# Patient Record
Sex: Male | Born: 1950 | Race: White | Hispanic: No | Marital: Married
Health system: Southern US, Community
[De-identification: ages and names within clinical notes are randomized; demographics above are authoritative.]

---

## 2010-09-05 ENCOUNTER — Emergency Department (HOSPITAL_COMMUNITY): Payer: Self-pay

## 2010-09-05 ENCOUNTER — Emergency Department (HOSPITAL_COMMUNITY)
Admission: EM | Admit: 2010-09-05 | Discharge: 2010-09-05 | Disposition: A | Discharge status: Home / Self Care | Payer: Self-pay | Attending: Emergency Medicine | Admitting: Emergency Medicine

## 2010-09-05 DIAGNOSIS — R339 Retention of urine, unspecified: Secondary | ICD-10-CM | POA: Insufficient documentation

## 2010-09-05 DIAGNOSIS — R319 Hematuria, unspecified: Secondary | ICD-10-CM | POA: Insufficient documentation

## 2010-09-05 DIAGNOSIS — N289 Disorder of kidney and ureter, unspecified: Secondary | ICD-10-CM | POA: Insufficient documentation

## 2010-09-05 DIAGNOSIS — R109 Unspecified abdominal pain: Secondary | ICD-10-CM | POA: Insufficient documentation

## 2010-09-05 LAB — DIFFERENTIAL
Lymphocytes Relative: 11 % — ABNORMAL LOW (ref 12–46)
Lymphs Abs: 1.1 10*3/uL (ref 0.7–4.0)
Monocytes Relative: 4 % (ref 3–12)
Neutro Abs: 8.1 10*3/uL — ABNORMAL HIGH (ref 1.7–7.7)
Neutrophils Relative %: 84 % — ABNORMAL HIGH (ref 43–77)

## 2010-09-05 LAB — BASIC METABOLIC PANEL
BUN: 18 mg/dL (ref 6–23)
CO2: 21 mEq/L (ref 19–32)
Chloride: 102 mEq/L (ref 96–112)
Creatinine, Ser: 1.35 mg/dL (ref 0.4–1.5)
Potassium: 3.6 mEq/L (ref 3.5–5.1)

## 2010-09-05 LAB — URINALYSIS, ROUTINE W REFLEX MICROSCOPIC
Bilirubin Urine: NEGATIVE
Glucose, UA: NEGATIVE mg/dL
Specific Gravity, Urine: 1.015 (ref 1.005–1.030)
Urobilinogen, UA: 0.2 mg/dL (ref 0.0–1.0)

## 2010-09-05 LAB — CBC
HCT: 47.8 % (ref 39.0–52.0)
Hemoglobin: 16.6 g/dL (ref 13.0–17.0)
MCH: 29.4 pg (ref 26.0–34.0)
MCV: 84.8 fL (ref 78.0–100.0)
Platelets: 218 10*3/uL (ref 150–400)
RBC: 5.64 MIL/uL (ref 4.22–5.81)
WBC: 9.7 10*3/uL (ref 4.0–10.5)

## 2010-09-05 LAB — URINE MICROSCOPIC-ADD ON

## 2010-09-11 ENCOUNTER — Ambulatory Visit: Payer: Self-pay | Admitting: Oncology

## 2010-10-09 ENCOUNTER — Ambulatory Visit: Payer: Self-pay | Admitting: Oncology

## 2010-11-17 ENCOUNTER — Emergency Department (HOSPITAL_COMMUNITY)
Admission: EM | Admit: 2010-11-17 | Discharge: 2010-11-17 | Disposition: A | Discharge status: Home / Self Care | Payer: Self-pay | Attending: Emergency Medicine | Admitting: Emergency Medicine

## 2010-11-17 DIAGNOSIS — Z85528 Personal history of other malignant neoplasm of kidney: Secondary | ICD-10-CM | POA: Insufficient documentation

## 2010-11-17 DIAGNOSIS — Z87442 Personal history of urinary calculi: Secondary | ICD-10-CM | POA: Insufficient documentation

## 2010-11-17 DIAGNOSIS — M109 Gout, unspecified: Secondary | ICD-10-CM | POA: Insufficient documentation

## 2010-11-17 DIAGNOSIS — L03319 Cellulitis of trunk, unspecified: Secondary | ICD-10-CM | POA: Insufficient documentation

## 2010-11-17 DIAGNOSIS — L02219 Cutaneous abscess of trunk, unspecified: Secondary | ICD-10-CM | POA: Insufficient documentation

## 2010-11-17 LAB — BASIC METABOLIC PANEL WITH GFR
BUN: 28 mg/dL — ABNORMAL HIGH (ref 6–23)
CO2: 30 meq/L (ref 19–32)
Calcium: 10.6 mg/dL — ABNORMAL HIGH (ref 8.4–10.5)
Chloride: 96 meq/L (ref 96–112)
Creatinine, Ser: 1.56 mg/dL — ABNORMAL HIGH (ref 0.4–1.5)
GFR calc Af Amer: 55 mL/min — ABNORMAL LOW
GFR calc non Af Amer: 46 mL/min — ABNORMAL LOW
Glucose, Bld: 104 mg/dL — ABNORMAL HIGH (ref 70–99)
Potassium: 4.7 meq/L (ref 3.5–5.1)
Sodium: 136 meq/L (ref 135–145)

## 2010-11-17 LAB — CBC
HCT: 39.9 % (ref 39.0–52.0)
Hemoglobin: 13.7 g/dL (ref 13.0–17.0)
MCH: 29.5 pg (ref 26.0–34.0)
MCHC: 34.3 g/dL (ref 30.0–36.0)
MCV: 86 fL (ref 78.0–100.0)
Platelets: 402 K/uL — ABNORMAL HIGH (ref 150–400)
RBC: 4.64 MIL/uL (ref 4.22–5.81)
RDW: 12.7 % (ref 11.5–15.5)
WBC: 10.7 K/uL — ABNORMAL HIGH (ref 4.0–10.5)

## 2010-11-17 LAB — DIFFERENTIAL
Basophils Absolute: 0 K/uL (ref 0.0–0.1)
Basophils Relative: 0 % (ref 0–1)
Eosinophils Absolute: 0.4 K/uL (ref 0.0–0.7)
Eosinophils Relative: 4 % (ref 0–5)
Lymphocytes Relative: 18 % (ref 12–46)
Lymphs Abs: 1.9 K/uL (ref 0.7–4.0)
Monocytes Absolute: 1.2 K/uL — ABNORMAL HIGH (ref 0.1–1.0)
Monocytes Relative: 11 % (ref 3–12)
Neutro Abs: 7.2 K/uL (ref 1.7–7.7)
Neutrophils Relative %: 67 % (ref 43–77)

## 2010-11-22 LAB — CULTURE, BLOOD (ROUTINE X 2): Culture: NO GROWTH

## 2011-02-01 ENCOUNTER — Encounter (INDEPENDENT_AMBULATORY_CARE_PROVIDER_SITE_OTHER): Payer: Self-pay | Admitting: *Deleted

## 2012-07-04 IMAGING — CT CT CHEST W/ CM
2 series · 15 of 31 positions shown, 19 images · non-contrast
Comparison: none

REASON FOR EXAM: kidney CA staging
COMMENTS:

[Series 2: soft tissue · axial · 0.76mm/px · z∈[-728,-678]mm · 2 of 69 slices shown]
[im 6/69  mediastinal]
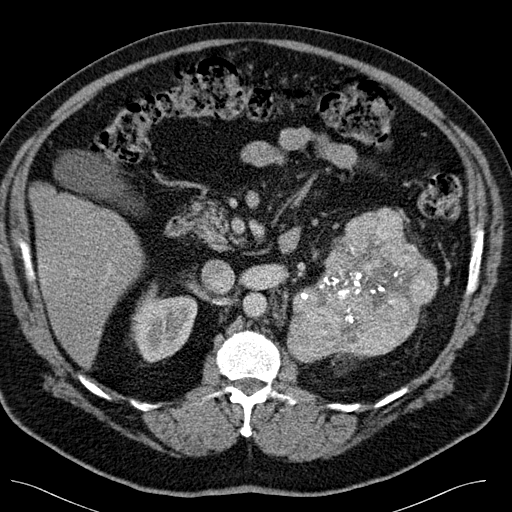
[im 16/69  mediastinal]
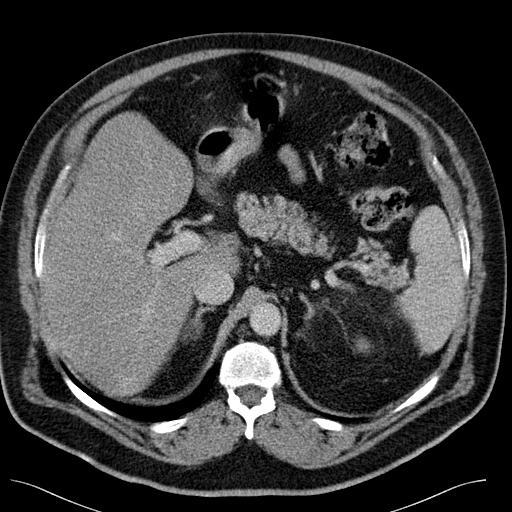

[Series 3: lung windows · axial · 0.76mm/px · z∈[-718,-443]mm · 13 of 67 slices shown, 17 images]
[im 6/67  mediastinal]
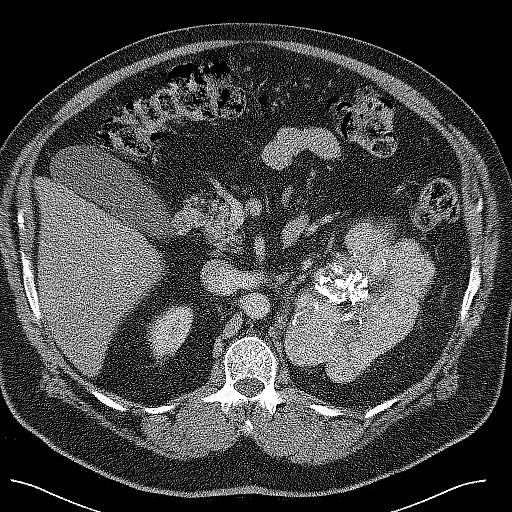
[im 6/67  lung]
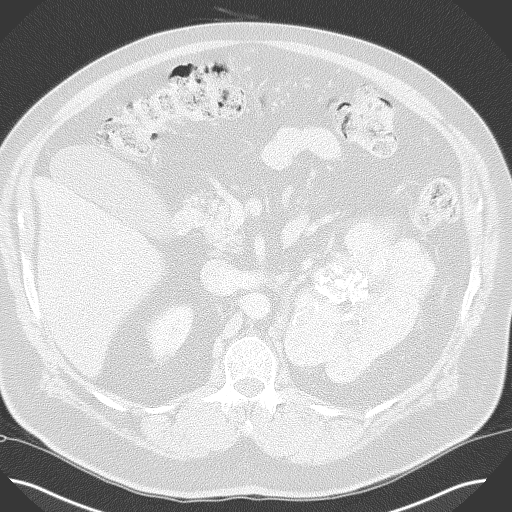
[im 11/67  lung]
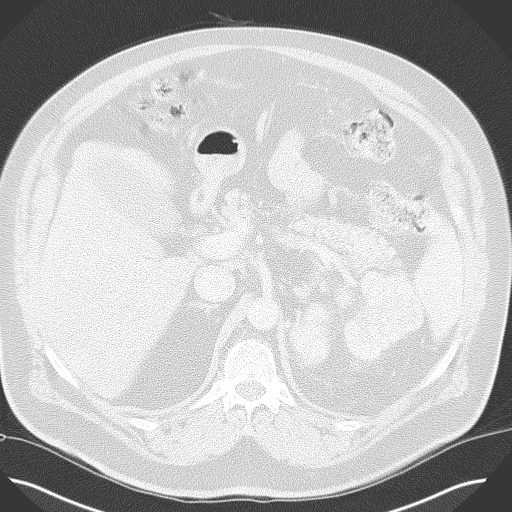
[im 16/67  lung]
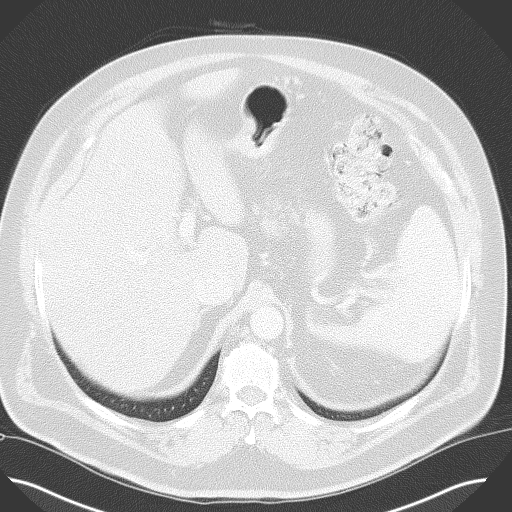
[im 21/67  lung]
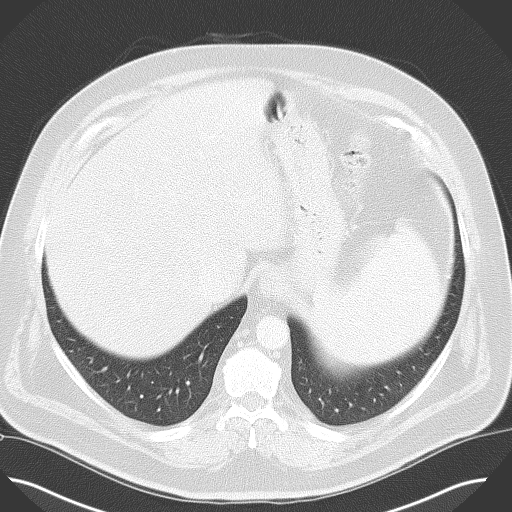
[im 26/67  mediastinal]
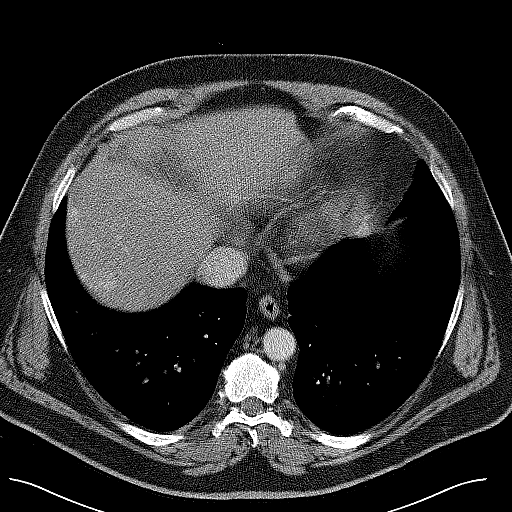
[im 26/67  lung]
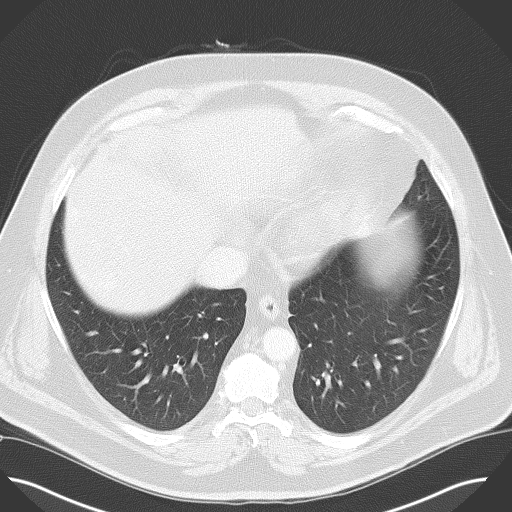
[im 31/67  lung]
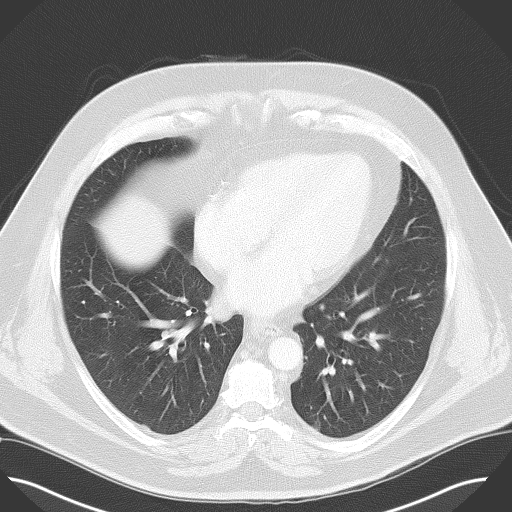
[im 34/67  lung]
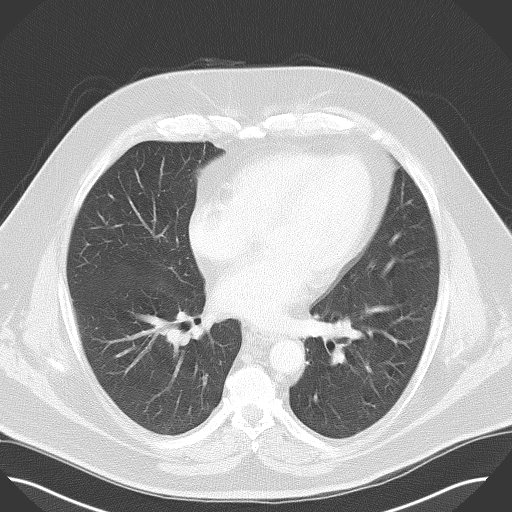
[im 36/67  lung]
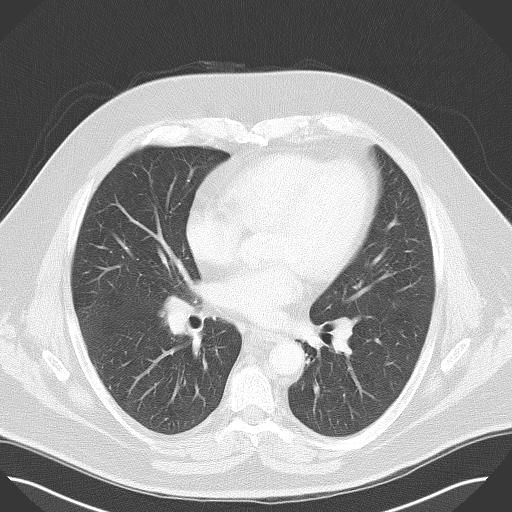
[im 41/67  mediastinal]
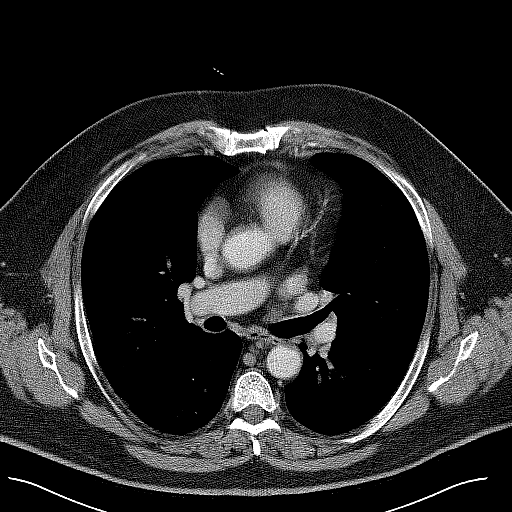
[im 41/67  lung]
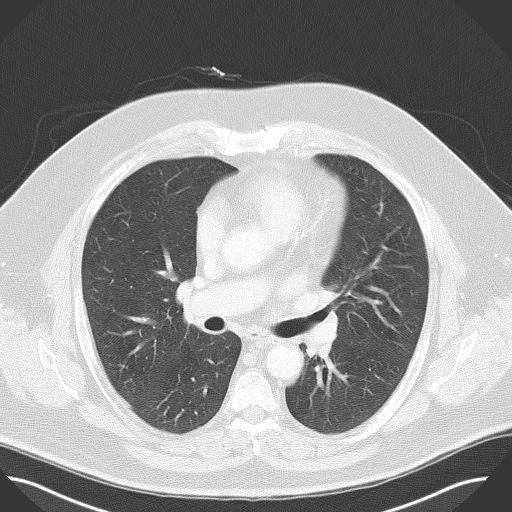
[im 46/67  lung]
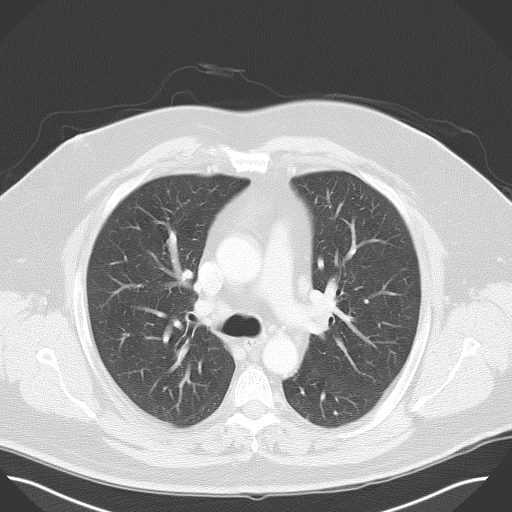
[im 51/67  lung]
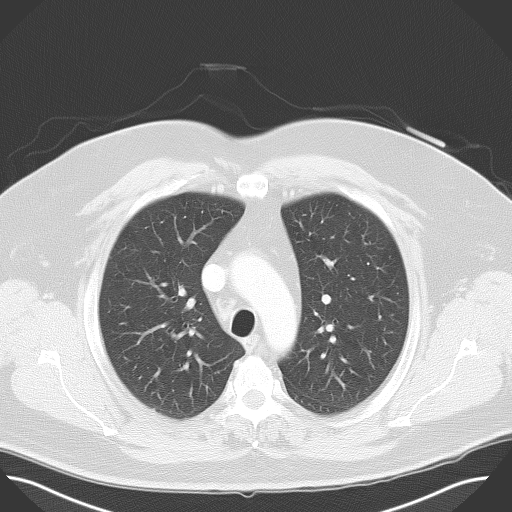
[im 56/67  lung]
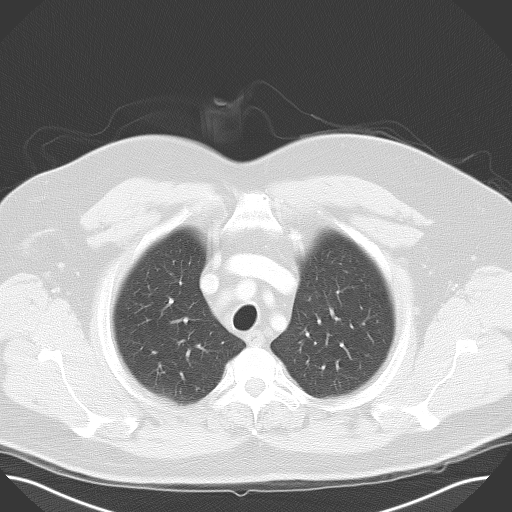
[im 61/67  mediastinal]
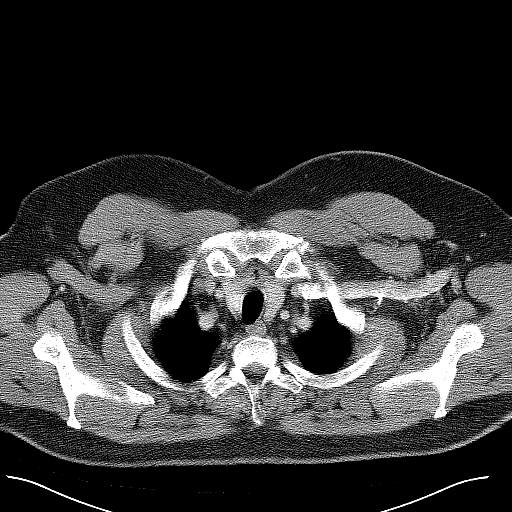
[im 61/67  lung]
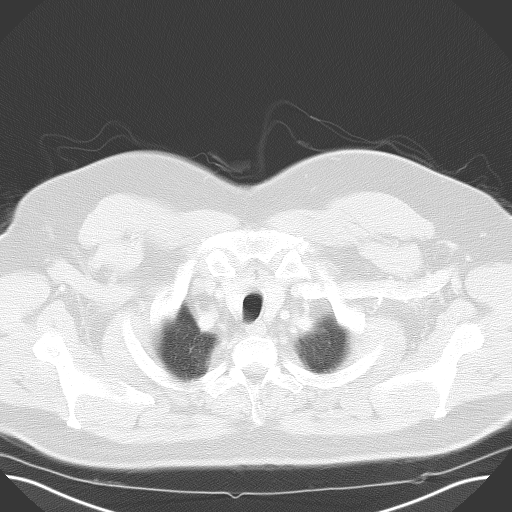

[15 of 31 positions shown; findings below may reference images not displayed]

PROCEDURE:     CT  - CT CHEST WITH CONTRAST  - September 19, 2010 [DATE]

RESULT:     Axial CT scanning was performed through the chest at 5 mm
intervals and slice thicknesses following intravenous administration of 75
cc of [id number] review of multiplanar reconstructed images was performed
separately on the VIA monitor. There are no previous CT scans with which to
compare.

There is a large partially calcified heterogeneously enhancing mass
associated with the visualized portion of the left kidney on the lowermost
images of the study. The visualized portions of the right kidney are normal.
The observed portions of the liver and spleen exhibit no acute abnormality.
The adrenal glands are normal in appearance. The gallbladder is mildly
distended and grossly normal. The observed portions of the pancreas as well
as the nondistended stomach are grossly normal.

The cardiac chambers are normal in size. The caliber of the thoracic aorta
is normal. There is a right paratracheal lymph node measuring 1.3 cm in
greatest dimension. There is an AP window lymph node measuring 1.4 cm in
greatest dimension. I do not see subcarinal nor hilar lymphadenopathy. The
thyroid lobes are normal in density and symmetric in size. I see no axillary
lymphadenopathy. There is no pleural nor pericardial effusion.

At lung window settings I see no interstitial nor alveolar infiltrates. The
thoracic vertebral bodies are preserved in height. I see no lytic nor
blastic bony lesion in
IMPRESSION: 1. There is a known large left renal mass that is partially included in the
field-of-view on this study.
2. There are borderline enlarged mediastinal lymph nodes. I do not see hilar
lymphadenopathy.
3. I do not see pulmonary parenchymal masses.
4. There is no pleural nor pericardial effusion.

## 2015-10-06 DIAGNOSIS — E784 Other hyperlipidemia: Secondary | ICD-10-CM | POA: Diagnosis not present

## 2015-10-06 DIAGNOSIS — I110 Hypertensive heart disease with heart failure: Secondary | ICD-10-CM | POA: Diagnosis not present

## 2015-10-06 DIAGNOSIS — R5382 Chronic fatigue, unspecified: Secondary | ICD-10-CM | POA: Diagnosis not present

## 2016-04-12 DIAGNOSIS — B351 Tinea unguium: Secondary | ICD-10-CM | POA: Diagnosis not present

## 2016-04-12 DIAGNOSIS — M79673 Pain in unspecified foot: Secondary | ICD-10-CM | POA: Diagnosis not present

## 2016-04-26 DIAGNOSIS — M79673 Pain in unspecified foot: Secondary | ICD-10-CM | POA: Diagnosis not present

## 2016-04-26 DIAGNOSIS — B351 Tinea unguium: Secondary | ICD-10-CM | POA: Diagnosis not present

## 2016-09-27 DIAGNOSIS — E784 Other hyperlipidemia: Secondary | ICD-10-CM | POA: Diagnosis not present

## 2016-09-27 DIAGNOSIS — I110 Hypertensive heart disease with heart failure: Secondary | ICD-10-CM | POA: Diagnosis not present

## 2016-09-27 DIAGNOSIS — E662 Morbid (severe) obesity with alveolar hypoventilation: Secondary | ICD-10-CM | POA: Diagnosis not present

## 2016-09-27 DIAGNOSIS — M2550 Pain in unspecified joint: Secondary | ICD-10-CM | POA: Diagnosis not present

## 2017-01-17 DIAGNOSIS — R5383 Other fatigue: Secondary | ICD-10-CM | POA: Diagnosis not present

## 2017-01-17 DIAGNOSIS — D510 Vitamin B12 deficiency anemia due to intrinsic factor deficiency: Secondary | ICD-10-CM | POA: Diagnosis not present

## 2017-01-17 DIAGNOSIS — I110 Hypertensive heart disease with heart failure: Secondary | ICD-10-CM | POA: Diagnosis not present

## 2017-01-17 DIAGNOSIS — E784 Other hyperlipidemia: Secondary | ICD-10-CM | POA: Diagnosis not present

## 2017-11-21 DIAGNOSIS — I110 Hypertensive heart disease with heart failure: Secondary | ICD-10-CM | POA: Diagnosis not present

## 2017-11-21 DIAGNOSIS — J309 Allergic rhinitis, unspecified: Secondary | ICD-10-CM | POA: Diagnosis not present

## 2017-11-21 DIAGNOSIS — M2550 Pain in unspecified joint: Secondary | ICD-10-CM | POA: Diagnosis not present

## 2017-11-21 DIAGNOSIS — E7849 Other hyperlipidemia: Secondary | ICD-10-CM | POA: Diagnosis not present

## 2018-01-01 DIAGNOSIS — M2550 Pain in unspecified joint: Secondary | ICD-10-CM | POA: Diagnosis not present

## 2018-01-01 DIAGNOSIS — E662 Morbid (severe) obesity with alveolar hypoventilation: Secondary | ICD-10-CM | POA: Diagnosis not present

## 2018-01-01 DIAGNOSIS — R5382 Chronic fatigue, unspecified: Secondary | ICD-10-CM | POA: Diagnosis not present

## 2018-01-01 DIAGNOSIS — G894 Chronic pain syndrome: Secondary | ICD-10-CM | POA: Diagnosis not present

## 2018-01-12 DIAGNOSIS — L600 Ingrowing nail: Secondary | ICD-10-CM | POA: Diagnosis not present

## 2018-01-12 DIAGNOSIS — M79674 Pain in right toe(s): Secondary | ICD-10-CM | POA: Diagnosis not present

## 2018-01-12 DIAGNOSIS — L03031 Cellulitis of right toe: Secondary | ICD-10-CM | POA: Diagnosis not present

## 2018-01-26 DIAGNOSIS — L600 Ingrowing nail: Secondary | ICD-10-CM | POA: Diagnosis not present

## 2018-01-26 DIAGNOSIS — L03031 Cellulitis of right toe: Secondary | ICD-10-CM | POA: Diagnosis not present

## 2018-01-26 DIAGNOSIS — M79674 Pain in right toe(s): Secondary | ICD-10-CM | POA: Diagnosis not present

## 2018-03-06 DIAGNOSIS — D510 Vitamin B12 deficiency anemia due to intrinsic factor deficiency: Secondary | ICD-10-CM | POA: Diagnosis not present

## 2018-03-06 DIAGNOSIS — R5383 Other fatigue: Secondary | ICD-10-CM | POA: Diagnosis not present

## 2018-03-06 DIAGNOSIS — E7849 Other hyperlipidemia: Secondary | ICD-10-CM | POA: Diagnosis not present

## 2018-03-06 DIAGNOSIS — I110 Hypertensive heart disease with heart failure: Secondary | ICD-10-CM | POA: Diagnosis not present

## 2019-08-12 ENCOUNTER — Ambulatory Visit: Payer: Medicare Other | Attending: Internal Medicine

## 2019-08-12 DIAGNOSIS — Z23 Encounter for immunization: Secondary | ICD-10-CM | POA: Insufficient documentation

## 2019-08-12 NOTE — Progress Notes (Signed)
   [facility]ov[facility] Vaccination [facility]  Name:  Michael Fisher    MRN: [id number] DOB: 06/14/1950  08/12/2019  [name] was observed post Covid-19 immunization for 15 minutes without incident. He was provided with Vaccine Information Sheet and instruction to access the V-Safe system.   [name] was instructed to call 911 with any severe reactions post vaccine: Marland Kitchen Difficulty breathing  . Swelling of face and throat  . A fast heartbeat  . A bad rash all over body  . Dizziness and weakness   Immunizations Administered    Name Date Dose VIS Date Route   Moderna COVID-19 Vaccine 08/12/2019  8:47 AM 0.5 mL 05/11/2019 Intramuscular   Manufacturer: Moderna   Lot: [id number]   NDC: 33825-053-97

## 2019-09-14 ENCOUNTER — Ambulatory Visit: Payer: Medicare Other | Attending: Internal Medicine

## 2019-09-14 DIAGNOSIS — Z23 Encounter for immunization: Secondary | ICD-10-CM

## 2019-09-14 NOTE — Progress Notes (Signed)
   Covid-19 Vaccination Clinic  Name:  Michael Fisher    MRN: [id number] DOB: April 20, 1951  09/14/2019  Michael Fisher was observed post Covid-19 immunization for 15 minutes without incident. He was provided with Vaccine Information Sheet and instruction to access the V-Safe system.   Michael Fisher was instructed to call 911 with any severe reactions post vaccine: Marland Kitchen Difficulty breathing  . Swelling of face and throat  . A fast heartbeat  . A bad rash all over body  . Dizziness and weakness   Immunizations Administered    Name Date Dose VIS Date Route   Moderna COVID-19 Vaccine 09/14/2019  8:25 AM 0.5 mL 05/11/2019 Intramuscular   Manufacturer: Moderna   Lot: [id number]   NDC: 49324-199-14
